# Patient Record
Sex: Male | Born: 1980 | Race: White | Hispanic: No | Marital: Married | Smoking: Never smoker
Health system: Southern US, Community
[De-identification: ages and names within clinical notes are randomized; demographics above are authoritative.]

## PROBLEM LIST (undated history)

## (undated) DIAGNOSIS — L659 Nonscarring hair loss, unspecified: Secondary | ICD-10-CM

## (undated) DIAGNOSIS — F419 Anxiety disorder, unspecified: Secondary | ICD-10-CM

## (undated) DIAGNOSIS — E785 Hyperlipidemia, unspecified: Secondary | ICD-10-CM

## (undated) HISTORY — DX: Nonscarring hair loss, unspecified: L65.9

## (undated) HISTORY — DX: Anxiety disorder, unspecified: F41.9

## (undated) HISTORY — DX: Hyperlipidemia, unspecified: E78.5

---

## 2016-06-08 DIAGNOSIS — E785 Hyperlipidemia, unspecified: Secondary | ICD-10-CM | POA: Diagnosis not present

## 2016-06-22 DIAGNOSIS — E785 Hyperlipidemia, unspecified: Secondary | ICD-10-CM | POA: Diagnosis not present

## 2016-08-21 DIAGNOSIS — L659 Nonscarring hair loss, unspecified: Secondary | ICD-10-CM | POA: Diagnosis not present

## 2016-08-21 DIAGNOSIS — E669 Obesity, unspecified: Secondary | ICD-10-CM | POA: Diagnosis not present

## 2016-08-21 DIAGNOSIS — F419 Anxiety disorder, unspecified: Secondary | ICD-10-CM | POA: Diagnosis not present

## 2016-08-21 DIAGNOSIS — E785 Hyperlipidemia, unspecified: Secondary | ICD-10-CM | POA: Diagnosis not present

## 2016-08-21 DIAGNOSIS — Z0000 Encounter for general adult medical examination without abnormal findings: Secondary | ICD-10-CM | POA: Diagnosis not present

## 2016-08-21 LAB — CBC AND DIFFERENTIAL
HCT: 45 (ref 41–53)
Hemoglobin: 15.8 (ref 13.5–17.5)
Platelets: 252 (ref 150–399)
WBC: 6.2

## 2016-08-21 LAB — BASIC METABOLIC PANEL
BUN: 18 (ref 4–21)
Creatinine: 1.1 (ref 0.6–1.3)
Glucose: 88
Potassium: 4.5 (ref 3.4–5.3)
Sodium: 139 (ref 137–147)

## 2016-08-21 LAB — TSH: TSH: 1.92 (ref 0.41–5.90)

## 2016-08-21 LAB — LIPID PANEL
Cholesterol: 228 — AB (ref 0–200)
HDL: 26 — AB (ref 35–70)
Triglycerides: 616 — AB (ref 40–160)

## 2016-08-21 LAB — HEPATIC FUNCTION PANEL
ALT: 34 (ref 10–40)
AST: 36 (ref 14–40)
Alkaline Phosphatase: 68 (ref 25–125)
Bilirubin, Total: 0.4

## 2016-08-21 LAB — HEMOGLOBIN A1C: Hemoglobin A1C: 5.4

## 2017-02-22 DIAGNOSIS — E782 Mixed hyperlipidemia: Secondary | ICD-10-CM | POA: Diagnosis not present

## 2017-02-22 DIAGNOSIS — Z23 Encounter for immunization: Secondary | ICD-10-CM | POA: Diagnosis not present

## 2017-02-22 DIAGNOSIS — Z6830 Body mass index (BMI) 30.0-30.9, adult: Secondary | ICD-10-CM | POA: Diagnosis not present

## 2017-02-22 DIAGNOSIS — E669 Obesity, unspecified: Secondary | ICD-10-CM | POA: Diagnosis not present

## 2017-08-17 ENCOUNTER — Encounter: Payer: Self-pay | Admitting: Family Medicine

## 2017-08-17 ENCOUNTER — Ambulatory Visit (INDEPENDENT_AMBULATORY_CARE_PROVIDER_SITE_OTHER): Payer: BLUE CROSS/BLUE SHIELD | Admitting: Family Medicine

## 2017-08-17 DIAGNOSIS — E785 Hyperlipidemia, unspecified: Secondary | ICD-10-CM | POA: Diagnosis not present

## 2017-08-17 DIAGNOSIS — F419 Anxiety disorder, unspecified: Secondary | ICD-10-CM | POA: Diagnosis not present

## 2017-08-17 DIAGNOSIS — L649 Androgenic alopecia, unspecified: Secondary | ICD-10-CM | POA: Diagnosis not present

## 2017-08-17 DIAGNOSIS — E782 Mixed hyperlipidemia: Secondary | ICD-10-CM | POA: Insufficient documentation

## 2017-08-17 DIAGNOSIS — E669 Obesity, unspecified: Secondary | ICD-10-CM | POA: Diagnosis not present

## 2017-08-17 MED ORDER — PHENTERMINE-TOPIRAMATE ER 7.5-46 MG PO CP24: ORAL_CAPSULE | ORAL | 0 refills | Status: DC

## 2017-08-17 MED ORDER — PHENTERMINE-TOPIRAMATE ER 11.25-69 MG PO CP24: 1.0000 | ORAL_CAPSULE | Freq: Every day | ORAL | 2 refills | Status: DC

## 2017-08-17 MED ORDER — PHENTERMINE-TOPIRAMATE ER 3.75-23 MG PO CP24: 1.0000 | ORAL_CAPSULE | Freq: Every day | ORAL | 0 refills | Status: DC

## 2017-08-17 NOTE — Patient Instructions (Signed)
It was very nice to meet you today!  I will send in the qsymia.  No other changes today.  Come back soon for your physical.  Come back in 3 months for a recheck on qsymia.  Take care, Dr Jimmey Ralph

## 2017-08-17 NOTE — Progress Notes (Signed)
Subjective:  Kevin Finley is a 37 y.o. male who presents today with a chief complaint of obesity and to establish care  HPI:  Obesity, chronic problem New to provider.  Several year history.  Has been on weight loss medications in the past including QSymia  Tolerated this well without any significant side effects.  Would like to restart medication today.  Weight has been stable.  Hyperlipidemia, chronic problem New to provider.  Has been on Crestor for about a year.  Also on fenofibrate.  Cholesterol levels have "always been high" and he hopes that they have been stable on his current medications.  No reported side effects from Crestor or fenofibrate.  Alopecia, chronic problem New to provider.  Stable.  Takes finasteride 1 mg daily.  Anxiety, chronic problem New to provider.  Stable on Lexapro 100 mg daily.  Tolerates this dose well without side effects.  ROS: Per HPI, otherwise a complete review of systems was negative.   PMH:  The following were reviewed and entered/updated in epic: Past Medical History:  Diagnosis Date  . Alopecia   . Anxiety   . Hyperlipidemia    Patient Active Problem List   Diagnosis Date Noted  . Obesity 08/17/2017  . Hyperlipidemia 08/17/2017  . 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 Anxiety 08/17/2017   History reviewed. No pertinent surgical history.  Family History  Problem Relation Age of Onset  . Heart disease Father   . Diabetes Maternal Grandmother   . Diabetes Maternal Grandfather   . Kidney disease Maternal Grandfather   . Heart disease Maternal Grandfather     Medications- reviewed and updated Current Outpatient Medications  Medication Sig Dispense Refill  . fenofibrate 160 MG tablet Take by mouth.    . finasteride (PROPECIA) 1 MG tablet TAKE ONE TABLET BY MOUTH ONCE DAILY    . rosuvastatin (CRESTOR) 10 MG tablet Take by mouth.    . sertraline (ZOLOFT) 100 MG tablet TAKE ONE TABLET BY MOUTH ONCE DAILY    .  Phentermine-Topiramate (QSYMIA) 11.25-69 MG CP24 Take 1 tablet by mouth daily. Take 1 pill daily for 14 days. 30 capsule 2  . Phentermine-Topiramate (QSYMIA) 3.75-23 MG CP24 Take 1 tablet by mouth daily for 14 days. 14 capsule 0  . [START ON 08/31/2017] Phentermine-Topiramate (QSYMIA) 7.5-46 MG CP24 Take 1 pill daily for 14 days. 14 capsule 0   No current facility-administered medications for this visit.     Allergies-reviewed and updated No Known Allergies  Social History   Socioeconomic History  . Marital status: Married    Spouse name: Not on file  . Number of children: 1  . Years of education: Not on file  . Highest education level: Not on file  Occupational History  . Not on file  Social Needs  . Financial resource strain: Not on file  . Food insecurity:    Worry: Not on file    Inability: Not on file  . Transportation needs:    Medical: Not on file    Non-medical: Not on file  Tobacco Use  . Smoking status: Never Smoker  . Smokeless tobacco: Never Used  Substance and Sexual Activity  . Alcohol use: Yes    Alcohol/week: 1.2 oz    Types: 2 Standard drinks or equivalent per week  . Drug use: Never  . Sexual activity: Not on file  Lifestyle  . Physical activity:    Days per week: Not on file    Minutes per session: Not on  file  . Stress: Not on file  Relationships  . Social connections:    Talks on phone: Not on file    Gets together: Not on file    Attends religious service: Not on file    Active member of club or organization: Not on file    Attends meetings of clubs or organizations: Not on file    Relationship status: Not on file  Other Topics Concern  . Not on file  Social History Narrative  . Not on file     Objective:  Physical Exam: BP (!) 128/98   Pulse 66   Temp 98.8 F (37.1 C)   Resp 14   Ht  (1.905 m)   Wt 250 lb (113.4 kg)   SpO2 96%   BMI 31.25 kg/m   Gen: NAD, resting comfortably CV: RRR with no murmurs appreciated Pulm:  NWOB, CTAB with no crackles, wheezes, or rhonchi GI: Obese, normal bowel sounds present. Soft, Nontender, Nondistended. MSK: No edema, cyanosis, or clubbing noted Skin: Warm, dry Neuro: Grossly normal, moves all extremities Psych: Normal affect and thought content  Assessment/Plan:  Obesity BMI 31.25 today.  Will restart qsymia per his request.  Discussed possible side effects of medication including palpitations, increased blood pressure, tachycardia, insomnia, increased agitation.  We will start at low dose and titrate to goal of 11.25-69 mg tablet daily.  He will follow-up with me in 3 months.  Hyperlipidemia Continue fenofibrate 160 mg daily and Crestor 10 mg daily.  Patient will return soon for fasting blood work including lipid panel.  Androgenic alopecia Stable.  Continue finasteride 1 mg daily.  Anxiety Stable.  Continue Zoloft 100 mg daily.  Preventative healthcare Obtain records from previous PCP.  Patient will follow-up soon for his CPE.  Katina Degree. Jimmey Ralph, MD 08/17/2017 9:35 AM

## 2017-08-17 NOTE — Assessment & Plan Note (Signed)
Stable.  Continue finasteride 1 mg daily. 

## 2017-08-17 NOTE — Assessment & Plan Note (Signed)
BMI 31.25 today.  Will restart qsymia per his request.  Discussed possible side effects of medication including palpitations, increased blood pressure, tachycardia, insomnia, increased agitation.  We will start at low dose and titrate to goal of 11.25-69 mg tablet daily.  He will follow-up with me in 3 months.

## 2017-08-17 NOTE — Assessment & Plan Note (Signed)
Stable.  Continue Zoloft 100 mg daily. 

## 2017-08-17 NOTE — Assessment & Plan Note (Signed)
Continue fenofibrate 160 mg daily and Crestor 10 mg daily.  Patient will return soon for fasting blood work including lipid panel.

## 2017-09-20 ENCOUNTER — Other Ambulatory Visit: Payer: Self-pay

## 2017-09-20 ENCOUNTER — Encounter: Payer: Self-pay | Admitting: Family Medicine

## 2017-09-20 MED ORDER — PHENTERMINE-TOPIRAMATE ER 7.5-46 MG PO CP24: 1.0000 | ORAL_CAPSULE | Freq: Every day | ORAL | 0 refills | Status: DC

## 2017-09-21 ENCOUNTER — Ambulatory Visit: Payer: BLUE CROSS/BLUE SHIELD | Admitting: Family Medicine

## 2017-09-21 DIAGNOSIS — Z0289 Encounter for other administrative examinations: Secondary | ICD-10-CM

## 2017-09-28 ENCOUNTER — Encounter: Payer: Self-pay | Admitting: Family Medicine

## 2017-09-28 ENCOUNTER — Ambulatory Visit (INDEPENDENT_AMBULATORY_CARE_PROVIDER_SITE_OTHER): Payer: BLUE CROSS/BLUE SHIELD | Admitting: Family Medicine

## 2017-09-28 VITALS — BP 132/78 | HR 74 | Temp 98.3°F | Ht 75.0 in | Wt 244.2 lb

## 2017-09-28 DIAGNOSIS — E669 Obesity, unspecified: Secondary | ICD-10-CM

## 2017-09-28 DIAGNOSIS — Z6830 Body mass index (BMI) 30.0-30.9, adult: Secondary | ICD-10-CM

## 2017-09-28 MED ORDER — FENOFIBRATE 160 MG PO TABS: 160.0000 mg | ORAL_TABLET | Freq: Every day | ORAL | 3 refills | Status: DC

## 2017-09-28 MED ORDER — ROSUVASTATIN CALCIUM 10 MG PO TABS: 10.0000 mg | ORAL_TABLET | Freq: Every day | ORAL | 3 refills | Status: DC

## 2017-09-28 MED ORDER — PHENTERMINE-TOPIRAMATE ER 11.25-69 MG PO CP24: 1.0000 | ORAL_CAPSULE | Freq: Every day | ORAL | 2 refills | Status: DC

## 2017-09-28 NOTE — Progress Notes (Signed)
   Subjective:  Kevin Finley is a 37 y.o. male who presents today with a chief complaint of obesity follow up.   HPI:  Obesity, chronic problem, improving Patient seen about a month ago for this.  We restarted his Q Samia.  He is currently on the 7.5-46.  He has been on this for the past week.  No reported side effects.  No chest pain, shortness of breath, palpitations, difficulty sleeping, or headaches.  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  ROS: Per HPI  Objective:  Physical Exam: BP 132/78 (BP Location: Left Arm, Patient Position: Sitting, Cuff Size: Normal)   Pulse 74   Temp 98.3 F (36.8 C) (Oral)   Ht 6\' 3"  (1.905 m)   Wt 244 lb 3.2 oz (110.8 kg)   SpO2 96%   BMI 30.52 kg/m   Wt Readings from Last 3 Encounters:  09/28/17 244 lb 3.2 oz (110.8 kg)  08/17/17 250 lb (113.4 kg)  Gen: NAD, resting comfortably CV: RRR with no murmurs appreciated.  Radial pulses 2+ bilaterally. Pulm: NWOB, CTAB with no crackles, wheezes, or rhonchi  Assessment/Plan:  Obesity Patient down about 6 pounds since his last visit.  Congratulated patient on his weight loss.  He has a goal of 220 pounds.  We will increase his dose of Qsymia to 11.25-69 mg per manufacturer instructions.  He will follow-up with me in 2 to 3 months for his CPE.  Preventative healthcare Patient will follow-up soon for CPE with blood work.  Katina Degreealeb M. Jimmey RalphParker, MD 09/28/2017 8:43 AM

## 2017-09-28 NOTE — Assessment & Plan Note (Signed)
Patient down about 6 pounds since his last visit.  Congratulated patient on his weight loss.  He has a goal of 220 pounds.  We will increase his dose of Qsymia to 11.25-69 mg per manufacturer instructions.  He will follow-up with me in 2 to 3 months for his CPE.

## 2017-09-28 NOTE — Patient Instructions (Signed)
It was very nice to see you today!  I am glad to hear that you are doing well.  Please continue your current dose of Qsymia until you run out of the 7.5 dose.  We will send in a new dose at 11.25.  Please let me know if you have any sort of palpitations, chest pain, increased anxiety or irritation, or difficulty sleeping.  Please let me know if you have increased blood pressure or heart rate as well.  Come back to see me in 2 to 3 months for your physical, or sooner as needed.  Take care, Dr Jimmey RalphParker

## 2018-02-01 ENCOUNTER — Encounter: Payer: Self-pay | Admitting: Family Medicine

## 2018-02-01 ENCOUNTER — Ambulatory Visit (INDEPENDENT_AMBULATORY_CARE_PROVIDER_SITE_OTHER): Payer: BLUE CROSS/BLUE SHIELD | Admitting: Family Medicine

## 2018-02-01 VITALS — BP 128/78 | HR 70 | Temp 98.6°F | Ht 75.0 in | Wt 250.8 lb

## 2018-02-01 DIAGNOSIS — R739 Hyperglycemia, unspecified: Secondary | ICD-10-CM

## 2018-02-01 DIAGNOSIS — E785 Hyperlipidemia, unspecified: Secondary | ICD-10-CM | POA: Diagnosis not present

## 2018-02-01 DIAGNOSIS — Z6830 Body mass index (BMI) 30.0-30.9, adult: Secondary | ICD-10-CM

## 2018-02-01 DIAGNOSIS — E669 Obesity, unspecified: Secondary | ICD-10-CM | POA: Diagnosis not present

## 2018-02-01 DIAGNOSIS — Z23 Encounter for immunization: Secondary | ICD-10-CM

## 2018-02-01 DIAGNOSIS — F419 Anxiety disorder, unspecified: Secondary | ICD-10-CM

## 2018-02-01 LAB — COMPREHENSIVE METABOLIC PANEL
ALT: 33 U/L (ref 0–53)
AST: 21 U/L (ref 0–37)
Albumin: 4.8 g/dL (ref 3.5–5.2)
Alkaline Phosphatase: 44 U/L (ref 39–117)
BUN: 21 mg/dL (ref 6–23)
CO2: 27 mEq/L (ref 19–32)
Calcium: 10.1 mg/dL (ref 8.4–10.5)
Chloride: 103 mEq/L (ref 96–112)
Creatinine, Ser: 1.33 mg/dL (ref 0.40–1.50)
GFR: 64.58 mL/min (ref >60.00)
Glucose, Bld: 93 mg/dL (ref 70–99)
Potassium: 4.8 mEq/L (ref 3.5–5.1)
Sodium: 138 mEq/L (ref 135–145)
Total Bilirubin: 0.5 mg/dL (ref 0.2–1.2)
Total Protein: 7.6 g/dL (ref 6.0–8.3)

## 2018-02-01 LAB — CBC
HCT: 46 % (ref 39.0–52.0)
Hemoglobin: 15.7 g/dL (ref 13.0–17.0)
MCHC: 34.1 g/dL (ref 30.0–36.0)
MCV: 88.9 fl (ref 78.0–100.0)
Platelets: 299 10*3/uL (ref 150.0–400.0)
RBC: 5.18 Mil/uL (ref 4.22–5.81)
RDW: 13.3 % (ref 11.5–15.5)
WBC: 5.5 10*3/uL (ref 4.0–10.5)

## 2018-02-01 LAB — LIPID PANEL
Cholesterol: 205 mg/dL — ABNORMAL HIGH (ref 0–200)
HDL: 34.7 mg/dL — ABNORMAL LOW (ref >39.00)
NonHDL: 170
Total CHOL/HDL Ratio: 6
Triglycerides: 231 mg/dL — ABNORMAL HIGH (ref 0.0–149.0)
VLDL: 46.2 mg/dL — ABNORMAL HIGH (ref 0.0–40.0)

## 2018-02-01 LAB — LDL CHOLESTEROL, DIRECT: Direct LDL: 128 mg/dL

## 2018-02-01 LAB — HEMOGLOBIN A1C: Hgb A1c MFr Bld: 5.5 % (ref 4.6–6.5)

## 2018-02-01 MED ORDER — SEMAGLUTIDE(0.25 OR 0.5MG/DOS) 2 MG/1.5ML ~~LOC~~ SOPN: 0.5000 mg | PEN_INJECTOR | SUBCUTANEOUS | 1 refills | Status: DC

## 2018-02-01 NOTE — Patient Instructions (Signed)
It was very nice to see you today!  We will start ozempic today. Please inject 0.25mg  once weekly.  Do this for 4 weeks, then increase to 0.5 mg weekly.  Come back to see me in 3 to 6 months.  Let me know if you have any severe side effects or if the medication does not work for you.   We will check blood work today.  We will give your flu shot today.  Take care, Dr Jimmey Ralph

## 2018-02-01 NOTE — Assessment & Plan Note (Addendum)
Stable.  Continue sertraline 100 mg daily. Check CBC and CMET.

## 2018-02-01 NOTE — Assessment & Plan Note (Signed)
Check lipid panel today.  Continue Crestor 10 mg daily and fenofibrate 160 mg daily.

## 2018-02-01 NOTE — Assessment & Plan Note (Signed)
BMI 31.35 today.  Discussed treatment options with patient.  We will start Ozempic 0.25 mg weekly for the next 4 weeks, then increase to 0.5 mg weekly.  Discussed possible side effects of this medication.  He will follow-up with me in 3 to 6 months.

## 2018-02-01 NOTE — Progress Notes (Signed)
   Subjective:  Kevin Finley is a 37 y.o. male who presents today with a chief complaint of obesity.   HPI:  Obesity, chronic problem, stable Last seen about 4 months ago this.  He has previously been on Qsymia, but has stopped over the  last several weeks.  He has been trying to eat more healthy.  He has gained about 6 pounds over the last several months.  HLD with hypertriglyceridemia Currently on Crestor 10 mg daily and fenofibrate 160 mg daily.  He has been compliant with these medications.  Anxiety On Zoloft 100 mg daily and tolerating well.  Symptoms are stable.  ROS: Per HPI  PMH: He reports that he has never smoked. He has never used smokeless tobacco. He reports that he drinks about 2.0 standard drinks of alcohol per week. He reports that he does not use drugs.  Objective:  Physical Exam: BP 128/78 (BP Location: Left Arm, Patient Position: Sitting, Cuff Size: Large)   Pulse 70   Temp 98.6 F (37 C) (Oral)   Ht 6\' 3"  (1.905 m)   Wt 250 lb 12.8 oz (113.8 kg)   SpO2 96%   BMI 31.35 kg/m   Wt Readings from Last 3 Encounters:  02/01/18 250 lb 12.8 oz (113.8 kg)  09/28/17 244 lb 3.2 oz (110.8 kg)  08/17/17 250 lb (113.4 kg)  Gen: NAD, resting comfortably CV: RRR with no murmurs appreciated Pulm: NWOB, CTAB with no crackles, wheezes, or rhonchi GI: Normal bowel sounds present. Soft, Nontender, Nondistended.  Assessment/Plan:  Hyperlipidemia Check lipid panel today.  Continue Crestor 10 mg daily and fenofibrate 160 mg daily.  Obesity BMI 31.35 today.  Discussed treatment options with patient.  We will start Ozempic 0.25 mg weekly for the next 4 weeks, then increase to 0.5 mg weekly.  Discussed possible side effects of this medication.  He will follow-up with me in 3 to 6 months.  Anxiety Stable.  Continue sertraline 100 mg daily. Check CBC and CMET.   Hyperglycemia Check A1c.   Preventative Healthcare Flu shot given today.   Katina Degree. Jimmey Ralph, MD 02/01/2018  10:48 AM

## 2018-02-04 ENCOUNTER — Telehealth: Payer: Self-pay | Admitting: Family Medicine

## 2018-02-04 NOTE — Progress Notes (Signed)
Please inform patient of the following:  Blood counts are normal.  Electrolytes, kidney function, liver function, and blood sugar levels are normal. His cholesterol is a bit high, but still improved from last year. I think this will continue to improve as he loses weight. Would recommend continuing his current doses of all medications and rechecking again in 1 year.  Katina Degree. Jimmey Ralph, MD 02/04/2018 5:01 PM

## 2018-02-04 NOTE — Telephone Encounter (Signed)
-----   Message from Frann Rider sent at 02/01/2018  8:24 AM EDT ----- Patient was charged fee and he actually reschedule his appt talked to billing and never got back to him.

## 2018-02-04 NOTE — Telephone Encounter (Signed)
I emailed charge corrections today to waive the $50 no show fee for date of service 09-21-17 out of courtesy. Awaiting a response email.

## 2018-02-07 ENCOUNTER — Encounter: Payer: Self-pay | Admitting: Family Medicine

## 2018-04-25 DIAGNOSIS — J343 Hypertrophy of nasal turbinates: Secondary | ICD-10-CM | POA: Diagnosis not present

## 2018-04-25 DIAGNOSIS — R0683 Snoring: Secondary | ICD-10-CM | POA: Diagnosis not present

## 2018-04-25 DIAGNOSIS — J358 Other chronic diseases of tonsils and adenoids: Secondary | ICD-10-CM | POA: Diagnosis not present

## 2018-05-24 DIAGNOSIS — Z3141 Encounter for fertility testing: Secondary | ICD-10-CM | POA: Diagnosis not present

## 2018-06-10 DIAGNOSIS — J111 Influenza due to unidentified influenza virus with other respiratory manifestations: Secondary | ICD-10-CM | POA: Diagnosis not present

## 2018-06-17 ENCOUNTER — Encounter: Payer: Self-pay | Admitting: Family Medicine

## 2018-07-22 ENCOUNTER — Other Ambulatory Visit: Payer: Self-pay

## 2018-07-22 MED ORDER — SERTRALINE HCL 100 MG PO TABS: 100.0000 mg | ORAL_TABLET | Freq: Every day | ORAL | 1 refills | Status: DC

## 2018-07-22 MED ORDER — FINASTERIDE 1 MG PO TABS: 1.0000 mg | ORAL_TABLET | Freq: Every day | ORAL | 1 refills | Status: DC

## 2018-10-28 ENCOUNTER — Encounter: Payer: Self-pay | Admitting: Family Medicine

## 2018-10-29 ENCOUNTER — Other Ambulatory Visit: Payer: Self-pay

## 2018-10-29 MED ORDER — ROSUVASTATIN CALCIUM 10 MG PO TABS: 10.0000 mg | ORAL_TABLET | Freq: Every day | ORAL | 0 refills | Status: DC

## 2018-10-29 MED ORDER — FENOFIBRATE 160 MG PO TABS: 160.0000 mg | ORAL_TABLET | Freq: Every day | ORAL | 0 refills | Status: DC

## 2018-11-07 ENCOUNTER — Encounter: Payer: Self-pay | Admitting: Sports Medicine

## 2018-11-07 ENCOUNTER — Other Ambulatory Visit: Payer: Self-pay

## 2018-11-07 ENCOUNTER — Ambulatory Visit (INDEPENDENT_AMBULATORY_CARE_PROVIDER_SITE_OTHER): Payer: BC Managed Care – PPO | Admitting: Sports Medicine

## 2018-11-07 VITALS — Temp 97.6°F | Resp 16

## 2018-11-07 DIAGNOSIS — M79674 Pain in right toe(s): Secondary | ICD-10-CM

## 2018-11-07 DIAGNOSIS — B351 Tinea unguium: Secondary | ICD-10-CM | POA: Diagnosis not present

## 2018-11-07 DIAGNOSIS — L659 Nonscarring hair loss, unspecified: Secondary | ICD-10-CM | POA: Insufficient documentation

## 2018-11-07 DIAGNOSIS — M79675 Pain in left toe(s): Secondary | ICD-10-CM | POA: Diagnosis not present

## 2018-11-07 DIAGNOSIS — B359 Dermatophytosis, unspecified: Secondary | ICD-10-CM | POA: Diagnosis not present

## 2018-11-07 DIAGNOSIS — L603 Nail dystrophy: Secondary | ICD-10-CM | POA: Diagnosis not present

## 2018-11-07 MED ORDER — NYSTATIN-TRIAMCINOLONE 100000-0.1 UNIT/GM-% EX OINT: 1.0000 "application " | TOPICAL_OINTMENT | Freq: Two times a day (BID) | CUTANEOUS | 0 refills | Status: DC

## 2018-11-07 MED ORDER — CLOTRIMAZOLE 1 % EX SOLN: 1.0000 "application " | Freq: Two times a day (BID) | CUTANEOUS | 5 refills | Status: DC

## 2018-11-07 NOTE — Progress Notes (Signed)
Subjective: Kevin Finley is a 38 y.o. male patient seen today in office with complaint of mildly painful thickened and discolored nails bilateral and itching and peeling skin. Patient is desiring treatment for nail changes; has tried OTC topicals/Medication in the past with no improvement. Reports that nails are becoming difficult to manage because of the thickness and skin is worsening over the last year. Patient has no other pedal complaints at this time.   Review of Systems  Skin: Positive for itching and rash.  All other systems reviewed and are negative.   Patient Active Problem List   Diagnosis Date Noted  . Hair loss 11/07/2018  . Obesity 08/17/2017  . Hyperlipidemia 08/17/2017  . 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 Anxiety 08/17/2017    Current Outpatient Medications on File Prior to Visit  Medication Sig Dispense Refill  . fenofibrate 160 MG tablet Take 1 tablet (160 mg total) by mouth daily. 30 tablet 0  . finasteride (PROPECIA) 1 MG tablet Take 1 tablet (1 mg total) by mouth daily. 90 tablet 1  . rosuvastatin (CRESTOR) 10 MG tablet Take 1 tablet (10 mg total) by mouth daily. 30 tablet 0  . sertraline (ZOLOFT) 100 MG tablet Take 1 tablet (100 mg total) by mouth daily. 90 tablet 1   No current facility-administered medications on file prior to visit.     No Known Allergies  Objective: Physical Exam  General: Well developed, nourished, no acute distress, awake, alert and oriented x 3  Vascular: Dorsalis pedis artery 2/4 bilateral, Posterior tibial artery 2/4 bilateral, skin temperature warm to warm proximal to distal bilateral lower extremities, no varicosities, pedal hair present bilateral.  Neurological: Gross sensation present via light touch bilateral.   Dermatological: Skin is warm, dry, and supple bilateral, Nails 1-10 are tender, short thick, and discolored with mild subungal debris with right 2&5 and left 2,3,&5 most involved, scaly skin in moccassion  distribution bilateral,  no webspace macerations present bilateral, no open lesions present bilateral, no callus/corns/hyperkeratotic tissue present bilateral. No signs of infection bilateral.  Musculoskeletal: Hallux extensus bilateral. Muscular strength within normal limits without painon range of motion. No pain with calf compression bilateral.  Assessment and Plan:  Problem List Items Addressed This Visit    None    Visit Diagnoses    Nail fungus    -  Primary   Relevant Medications   nystatin-triamcinolone ointment (MYCOLOG)   clotrimazole (LOTRIMIN) 1 % external solution   Other Relevant Orders   Culture, fungus without smear   Tinea       Relevant Medications   nystatin-triamcinolone ointment (MYCOLOG)   clotrimazole (LOTRIMIN) 1 % external solution   Toe pain, bilateral          -Examined patient -Discussed treatment options for painful dystrophic nails  -Fungal culture was obtained by removing a portion of the hard nail itself from each of the involved toenails using a sterile nail nipper and sent to [facility] lab. Patient tolerated the biopsy procedure well without discomfort or need for anesthesia.  -Rx Mycolog and Lotrimin solution to use as instructed  -Patient to return in 3-4 weeks for follow up evaluation and discussion of fungal culture results or sooner if symptoms worsen.  Landis Martins, DPM

## 2018-11-11 ENCOUNTER — Ambulatory Visit (INDEPENDENT_AMBULATORY_CARE_PROVIDER_SITE_OTHER): Payer: BLUE CROSS/BLUE SHIELD | Admitting: Family Medicine

## 2018-11-11 ENCOUNTER — Encounter: Payer: Self-pay | Admitting: Family Medicine

## 2018-11-11 VITALS — Temp 97.5°F | Wt 249.0 lb

## 2018-11-11 DIAGNOSIS — Z6830 Body mass index (BMI) 30.0-30.9, adult: Secondary | ICD-10-CM

## 2018-11-11 DIAGNOSIS — L649 Androgenic alopecia, unspecified: Secondary | ICD-10-CM

## 2018-11-11 DIAGNOSIS — E669 Obesity, unspecified: Secondary | ICD-10-CM

## 2018-11-11 DIAGNOSIS — F419 Anxiety disorder, unspecified: Secondary | ICD-10-CM

## 2018-11-11 DIAGNOSIS — E785 Hyperlipidemia, unspecified: Secondary | ICD-10-CM

## 2018-11-11 MED ORDER — SERTRALINE HCL 100 MG PO TABS: 100.0000 mg | ORAL_TABLET | Freq: Every day | ORAL | 1 refills | Status: DC

## 2018-11-11 MED ORDER — FENOFIBRATE 160 MG PO TABS: 160.0000 mg | ORAL_TABLET | Freq: Every day | ORAL | 3 refills | Status: DC

## 2018-11-11 MED ORDER — RYBELSUS 3 MG PO TABS: 3.0000 mg | ORAL_TABLET | Freq: Every day | ORAL | 5 refills | Status: DC

## 2018-11-11 MED ORDER — FINASTERIDE 1 MG PO TABS: 1.0000 mg | ORAL_TABLET | Freq: Every day | ORAL | 1 refills | Status: DC

## 2018-11-11 MED ORDER — ROSUVASTATIN CALCIUM 10 MG PO TABS: 10.0000 mg | ORAL_TABLET | Freq: Every day | ORAL | 3 refills | Status: DC

## 2018-11-11 NOTE — Assessment & Plan Note (Signed)
Stable.  Continue finasteride 1 mg daily. 

## 2018-11-11 NOTE — Progress Notes (Signed)
    Chief Complaint:  Kevin Finley is a 38 y.o. male who presents today for a virtual office visit with a chief complaint of anxiety follow up.   Assessment/Plan:  Anxiety Stable.  Continue sertraline 100 mg daily.  Androgenic alopecia Stable.  Continue finasteride 1 mg daily.  Hyperlipidemia Stable.  Continue fenofibrate 160 mg daily and Crestor 10 mg daily.  Check lipid panel next blood draw.  Obesity Discussed lifestyle modifications.  Did not have insurance coverage for Ozempic unfortunately.  Will try rybelsus.     Subjective:  HPI:  His stable, chronic medical conditions are outlined below:  #Anxiety - On Zoloft 100 mg daily and tolerating well - ROS: No reported SI or HI.  #Antigenic of the patient - On finasteride 1 mg daily and tolerating well  #Dyslipidemia - On fenofibrate 160 mg daily and Crestor 10 mg daily.  Tolerating both of these well without side effects.  # Obesity -Not currently on any medication.  Insurance did not cover [place].  ROS: Per HPI  PMH: He reports that he has never smoked. He has never used smokeless tobacco. He reports current alcohol use of about 2.0 standard drinks of alcohol per week. He reports that he does not use drugs.      Objective/Observations  Physical Exam: Gen: NAD, resting comfortably Pulm: Normal work of breathing Neuro: Grossly normal, moves all extremities Psych: Normal affect and thought content  Virtual Visit via Video   I connected with [employer] on 11/11/18 at  4:20 PM EDT by a video enabled telemedicine application and verified that I am speaking with the correct person using two identifiers. I discussed the limitations of evaluation and management by telemedicine and the availability of in person appointments. The patient expressed understanding and agreed to proceed.   Patient location: Home Provider location: [place] participating in the virtual visit: Myself and  Patient     Algis Greenhouse. Jerline Pain, MD 11/11/2018 11:49 AM

## 2018-11-11 NOTE — Assessment & Plan Note (Signed)
Stable.  Continue fenofibrate 160 mg daily and Crestor 10 mg daily.  Check lipid panel next blood draw.

## 2018-11-11 NOTE — Assessment & Plan Note (Signed)
Stable. Continue sertraline 100mg daily

## 2018-11-11 NOTE — Assessment & Plan Note (Addendum)
Discussed lifestyle modifications.  Did not have insurance coverage for Ozempic unfortunately.  Will try rybelsus. Follow up with me in a few months.

## 2018-11-18 DIAGNOSIS — L739 Follicular disorder, unspecified: Secondary | ICD-10-CM | POA: Diagnosis not present

## 2018-12-05 ENCOUNTER — Ambulatory Visit (INDEPENDENT_AMBULATORY_CARE_PROVIDER_SITE_OTHER): Payer: BC Managed Care – PPO | Admitting: Sports Medicine

## 2018-12-05 ENCOUNTER — Encounter: Payer: Self-pay | Admitting: Sports Medicine

## 2018-12-05 ENCOUNTER — Other Ambulatory Visit: Payer: Self-pay

## 2018-12-05 DIAGNOSIS — B359 Dermatophytosis, unspecified: Secondary | ICD-10-CM | POA: Diagnosis not present

## 2018-12-05 DIAGNOSIS — M79674 Pain in right toe(s): Secondary | ICD-10-CM | POA: Diagnosis not present

## 2018-12-05 DIAGNOSIS — B351 Tinea unguium: Secondary | ICD-10-CM | POA: Diagnosis not present

## 2018-12-05 DIAGNOSIS — M79675 Pain in left toe(s): Secondary | ICD-10-CM | POA: Diagnosis not present

## 2018-12-05 NOTE — Progress Notes (Signed)
Subjective: Kevin Finley is a 38 y.o. male patient seen today in office for fungal culture results. Patient has no other pedal complaints at this time.   Patient Active Problem List   Diagnosis Date Noted  . Obesity 08/17/2017  . Hyperlipidemia 08/17/2017  . 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 Anxiety 08/17/2017    Current Outpatient Medications on File Prior to Visit  Medication Sig Dispense Refill  . clotrimazole (LOTRIMIN) 1 % external solution Apply 1 application topically 2 (two) times daily. In between toes 60 mL 5  . fenofibrate 160 MG tablet Take 1 tablet (160 mg total) by mouth daily. 90 tablet 3  . finasteride (PROPECIA) 1 MG tablet Take 1 tablet (1 mg total) by mouth daily. 90 tablet 1  . nystatin-triamcinolone ointment (MYCOLOG) Apply 1 application topically 2 (two) times daily. 30 g 0  . rosuvastatin (CRESTOR) 10 MG tablet Take 1 tablet (10 mg total) by mouth daily. 90 tablet 3  . Semaglutide (RYBELSUS) 3 MG TABS Take 3 mg by mouth daily. 30 tablet 5  . sertraline (ZOLOFT) 100 MG tablet Take 1 tablet (100 mg total) by mouth daily. 90 tablet 1   No current facility-administered medications on file prior to visit.     No Known Allergies  Objective: Physical Exam  General: Well developed, nourished, no acute distress, awake, alert and oriented x 3  Vascular: Dorsalis pedis artery 2/4 bilateral, Posterior tibial artery 2/4 bilateral, skin temperature warm to warm proximal to distal bilateral lower extremities, no varicosities, pedal hair present bilateral.  Neurological: Gross sensation present via light touch bilateral.   Dermatological: Skin is warm, dry, and supple bilateral, Nails 1-10 are tender, short thick, and discolored with mild subungal debris with right 2&5 and left 2,3,&5 most involved, scaly skin in moccassion distribution bilateral, mildly improved, no webspace macerations present bilateral, no open lesions present bilateral, no callus/corns/hyperkeratotic  tissue present bilateral. No signs of infection bilateral.  Musculoskeletal: Hallux extensus bilateral. Muscular strength within normal limits without painon range of motion. No pain with calf compression bilateral.  Fungal culture + T Rubrum  Assessment and Plan:  Problem List Items Addressed This Visit    None    Visit Diagnoses    Nail fungus    -  Primary   Relevant Orders   Hepatic Function Panel   Toe pain, bilateral       Tinea          -Examined patient -Discussed treatment options for painful mycotic nails -Patient opt for oral Lamisil with full understanding of medication risks; ordered LFTs for review if within normal limits will proceed with sending Rx to pharmacy for lamisil 250mg  PO daily. Anticipate 12 week course.  -Continue with topical lotrim and Myclog creams  -Advised good hygiene habits -Patient to return in 6 weeks for follow up evaluation or sooner if symptoms worsen.  Landis Martins, DPM

## 2018-12-09 DIAGNOSIS — B351 Tinea unguium: Secondary | ICD-10-CM | POA: Diagnosis not present

## 2018-12-09 DIAGNOSIS — Z31441 Encounter for testing of male partner of patient with recurrent pregnancy loss: Secondary | ICD-10-CM | POA: Diagnosis not present

## 2018-12-10 LAB — HEPATIC FUNCTION PANEL
ALT: 31 IU/L (ref 0–44)
AST: 35 IU/L (ref 0–40)
Albumin: 5.1 g/dL — ABNORMAL HIGH (ref 4.0–5.0)
Alkaline Phosphatase: 53 IU/L (ref 39–117)
Bilirubin Total: 0.5 mg/dL (ref 0.0–1.2)
Bilirubin, Direct: 0.12 mg/dL (ref 0.00–0.40)
Total Protein: 7.8 g/dL (ref 6.0–8.5)

## 2018-12-11 ENCOUNTER — Other Ambulatory Visit: Payer: Self-pay | Admitting: Sports Medicine

## 2018-12-11 DIAGNOSIS — B351 Tinea unguium: Secondary | ICD-10-CM

## 2018-12-11 MED ORDER — TERBINAFINE HCL 250 MG PO TABS: 250.0000 mg | ORAL_TABLET | Freq: Every day | ORAL | 0 refills | Status: DC

## 2018-12-11 NOTE — Progress Notes (Signed)
Labs reviewed, Lamisil sent to pharmacy -Dr. Cannon Kettle

## 2018-12-12 ENCOUNTER — Telehealth: Payer: Self-pay

## 2018-12-12 NOTE — Telephone Encounter (Signed)
Called Pt back to inform him of his lab results and the Rx (Lamisl) that was sent to his pharmacy. Pt was told to follow up in October. Pt stated understanding

## 2018-12-12 NOTE — Progress Notes (Signed)
Called Pt back and informed him of his lab results and about his Rx (lamisil) that was sent to his pharmacy.

## 2018-12-12 NOTE — Telephone Encounter (Signed)
-----   Message from Landis Martins, [place] sent at 12/11/2018  1:41 PM EDT ----- Will you let patient know that I reviewed his labs and sent Lamisil to his pharmacy for him to take 1 tab daily for his fungus. We will re-check him in October to see how he is doing with the medication Thanks Dr. Cannon Kettle

## 2019-01-17 ENCOUNTER — Ambulatory Visit (INDEPENDENT_AMBULATORY_CARE_PROVIDER_SITE_OTHER): Payer: BC Managed Care – PPO | Admitting: Sports Medicine

## 2019-01-17 ENCOUNTER — Other Ambulatory Visit: Payer: Self-pay

## 2019-01-17 ENCOUNTER — Encounter: Payer: Self-pay | Admitting: Sports Medicine

## 2019-01-17 DIAGNOSIS — S90122A Contusion of left lesser toe(s) without damage to nail, initial encounter: Secondary | ICD-10-CM | POA: Diagnosis not present

## 2019-01-17 DIAGNOSIS — B351 Tinea unguium: Secondary | ICD-10-CM

## 2019-01-17 DIAGNOSIS — M79675 Pain in left toe(s): Secondary | ICD-10-CM | POA: Diagnosis not present

## 2019-01-17 DIAGNOSIS — M79674 Pain in right toe(s): Secondary | ICD-10-CM | POA: Diagnosis not present

## 2019-01-17 NOTE — Progress Notes (Signed)
Subjective: Kevin Finley is a 38 y.o. male patient seen today in office for follow up evaluation of nail fungus on Lamisil. Patient states that he is doing well with no adverse reaction can see some clearance. Reports that a ladder fell on foot and hit his left 2nd toe last night. Patient denies pain or drainage but admits swelling an redness. Patient has no other pedal complaints at this time.   Patient Active Problem List   Diagnosis Date Noted  . Obesity 08/17/2017  . Hyperlipidemia 08/17/2017  . 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 Anxiety 08/17/2017    Current Outpatient Medications on File Prior to Visit  Medication Sig Dispense Refill  . clotrimazole (LOTRIMIN) 1 % external solution Apply 1 application topically 2 (two) times daily. In between toes 60 mL 5  . fenofibrate 160 MG tablet Take 1 tablet (160 mg total) by mouth daily. 90 tablet 3  . finasteride (PROPECIA) 1 MG tablet Take 1 tablet (1 mg total) by mouth daily. 90 tablet 1  . nystatin-triamcinolone ointment (MYCOLOG) Apply 1 application topically 2 (two) times daily. 30 g 0  . rosuvastatin (CRESTOR) 10 MG tablet Take 1 tablet (10 mg total) by mouth daily. 90 tablet 3  . Semaglutide (RYBELSUS) 3 MG TABS Take 3 mg by mouth daily. 30 tablet 5  . sertraline (ZOLOFT) 100 MG tablet Take 1 tablet (100 mg total) by mouth daily. 90 tablet 1  . terbinafine (LAMISIL) 250 MG tablet Take 1 tablet (250 mg total) by mouth daily. 90 tablet 0   No current facility-administered medications on file prior to visit.     No Known Allergies  Objective: Physical Exam  General: Well developed, nourished, no acute distress, awake, alert and oriented x 3  Vascular: Dorsalis pedis artery 2/4 bilateral, Posterior tibial artery 2/4 bilateral, skin temperature warm to warm proximal to distal bilateral lower extremities, no varicosities, pedal hair present bilateral.  Neurological: Gross sensation present via light touch bilateral.    Dermatological: Skin is warm, dry, and supple bilateral, Nails 1-10 are tender, short thick, and discolored with mild subungal debris and early clearance noted at proximal nail bed, There is blood at 2nd toenail with mild soft tissue swelling and ecchymosis with 2nd toenail well attached at left 2nd toe,  No signs of infection bilateral.  Musculoskeletal: No symptomatic boney deformities noted bilateral. Muscular strength within normal limits without painon range of motion. No pain with calf compression bilateral.  Assessment and Plan:  Problem List Items Addressed This Visit    None    Visit Diagnoses    Nail fungus    -  Primary   Relevant Orders   Hepatic Function Panel   Toe pain, bilateral       Contusion of lesser toe of left foot without damage to nail, initial encounter         -Examined patient -Patient declined xrays of left foot -Recommend rest, ice, elevation until 2nd toe is improved -Cont with Lamisil; a new set of LFTs were ordered; will call patient to stop medication if abnormal  -Advised good hygiene habits and educated patient on proper foot care to prevent re-infection -Patient to return in 6 weeks for follow up evaluation or sooner if symptoms worsen.  Landis Martins, DPM

## 2019-02-28 ENCOUNTER — Ambulatory Visit (INDEPENDENT_AMBULATORY_CARE_PROVIDER_SITE_OTHER): Payer: BC Managed Care – PPO | Admitting: Sports Medicine

## 2019-02-28 ENCOUNTER — Other Ambulatory Visit: Payer: Self-pay

## 2019-02-28 ENCOUNTER — Encounter: Payer: Self-pay | Admitting: Sports Medicine

## 2019-02-28 DIAGNOSIS — S90122D Contusion of left lesser toe(s) without damage to nail, subsequent encounter: Secondary | ICD-10-CM | POA: Diagnosis not present

## 2019-02-28 DIAGNOSIS — B351 Tinea unguium: Secondary | ICD-10-CM

## 2019-02-28 DIAGNOSIS — M79675 Pain in left toe(s): Secondary | ICD-10-CM

## 2019-02-28 DIAGNOSIS — M79674 Pain in right toe(s): Secondary | ICD-10-CM | POA: Diagnosis not present

## 2019-02-28 NOTE — Progress Notes (Signed)
Subjective: Kevin Finley is a 38 y.o. male patient seen today in office for follow up evaluation of nail fungus on Lamisil. Patient states that he is doing well with no adverse reaction. Patient has no other pedal complaints at this time.   Patient Active Problem List   Diagnosis Date Noted  . Obesity 08/17/2017  . Hyperlipidemia 08/17/2017  . 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 Anxiety 08/17/2017    Current Outpatient Medications on File Prior to Visit  Medication Sig Dispense Refill  . clotrimazole (LOTRIMIN) 1 % external solution Apply 1 application topically 2 (two) times daily. In between toes 60 mL 5  . fenofibrate 160 MG tablet Take 1 tablet (160 mg total) by mouth daily. 90 tablet 3  . finasteride (PROPECIA) 1 MG tablet Take 1 tablet (1 mg total) by mouth daily. 90 tablet 1  . nystatin-triamcinolone ointment (MYCOLOG) Apply 1 application topically 2 (two) times daily. 30 g 0  . rosuvastatin (CRESTOR) 10 MG tablet Take 1 tablet (10 mg total) by mouth daily. 90 tablet 3  . Semaglutide (RYBELSUS) 3 MG TABS Take 3 mg by mouth daily. 30 tablet 5  . sertraline (ZOLOFT) 100 MG tablet Take 1 tablet (100 mg total) by mouth daily. 90 tablet 1  . terbinafine (LAMISIL) 250 MG tablet Take 1 tablet (250 mg total) by mouth daily. 90 tablet 0   No current facility-administered medications on file prior to visit.     No Known Allergies  Objective: Physical Exam  General: Well developed, nourished, no acute distress, awake, alert and oriented x 3  Vascular: Dorsalis pedis artery 2/4 bilateral, Posterior tibial artery 2/4 bilateral, skin temperature warm to warm proximal to distal bilateral lower extremities, no varicosities, pedal hair present bilateral.  Neurological: Gross sensation present via light touch bilateral.   Dermatological: Skin is warm, dry, and supple bilateral, Nails 1-10 are tender, short thick, and discolored with mild subungal debris and early clearance noted at proximal  nail bed, there is dry blood noted at the left second toe appears to be stable with no acute signs of infection, no webspace macerations present bilateral, no open lesions present bilateral, no callus/corns/hyperkeratotic tissue present bilateral. No signs of infection bilateral.  Musculoskeletal: No symptomatic boney deformities noted bilateral. Muscular strength within normal limits without painon range of motion. No pain with calf compression bilateral.  Assessment and Plan:  Problem List Items Addressed This Visit    None    Visit Diagnoses    Nail fungus    -  Primary   Relevant Orders   Hepatic Function Panel   Toe pain, bilateral       Contusion of lesser toe of left foot without damage to nail, subsequent encounter          -Examined patient -Cont with Lamisil; a new set of LFTs were ordered; will call patient to stop medication if abnormal  -Advised good hygiene habits and educated patient on proper foot care to prevent re-infection -Advised patient to continue with monitoring left second toe dry blood will slowly grow out -Patient to return as needed or sooner if symptoms worsen.  Landis Martins, DPM

## 2019-07-16 ENCOUNTER — Other Ambulatory Visit: Payer: Self-pay | Admitting: *Deleted

## 2019-07-16 MED ORDER — SERTRALINE HCL 100 MG PO TABS: 100.0000 mg | ORAL_TABLET | Freq: Every day | ORAL | 0 refills | Status: DC

## 2019-07-16 MED ORDER — FINASTERIDE 1 MG PO TABS: 1.0000 mg | ORAL_TABLET | Freq: Every day | ORAL | 0 refills | Status: DC

## 2019-12-11 ENCOUNTER — Other Ambulatory Visit: Payer: Self-pay | Admitting: *Deleted

## 2019-12-11 NOTE — Telephone Encounter (Signed)
Requesting refill Rx Sertraline   Last refills on 07/16/2019 #90   0 Ref  Last OV 12/08/2018

## 2019-12-12 MED ORDER — FINASTERIDE 1 MG PO TABS: 1.0000 mg | ORAL_TABLET | Freq: Every day | ORAL | 0 refills | Status: DC

## 2019-12-12 MED ORDER — SERTRALINE HCL 100 MG PO TABS: 100.0000 mg | ORAL_TABLET | Freq: Every day | ORAL | 0 refills | Status: DC

## 2019-12-12 NOTE — Telephone Encounter (Signed)
30 day supply sent in - he needs OV  Kevin Finley. Jimmey Ralph, MD 12/12/2019 2:22 PM

## 2019-12-12 NOTE — Telephone Encounter (Signed)
Pt notified, will call after the weekend to schedule appointment with PCP

## 2019-12-22 ENCOUNTER — Other Ambulatory Visit: Payer: Self-pay | Admitting: Family Medicine

## 2020-04-29 ENCOUNTER — Encounter: Payer: Self-pay | Admitting: Family Medicine

## 2020-04-29 ENCOUNTER — Other Ambulatory Visit: Payer: Self-pay

## 2020-04-29 ENCOUNTER — Ambulatory Visit (INDEPENDENT_AMBULATORY_CARE_PROVIDER_SITE_OTHER): Payer: BC Managed Care – PPO | Admitting: Family Medicine

## 2020-04-29 VITALS — BP 125/84 | HR 62 | Temp 97.6°F | Ht 75.0 in | Wt 249.0 lb

## 2020-04-29 DIAGNOSIS — R739 Hyperglycemia, unspecified: Secondary | ICD-10-CM

## 2020-04-29 DIAGNOSIS — G4730 Sleep apnea, unspecified: Secondary | ICD-10-CM | POA: Diagnosis not present

## 2020-04-29 DIAGNOSIS — F419 Anxiety disorder, unspecified: Secondary | ICD-10-CM | POA: Diagnosis not present

## 2020-04-29 DIAGNOSIS — E785 Hyperlipidemia, unspecified: Secondary | ICD-10-CM | POA: Diagnosis not present

## 2020-04-29 DIAGNOSIS — Z23 Encounter for immunization: Secondary | ICD-10-CM | POA: Diagnosis not present

## 2020-04-29 LAB — COMPREHENSIVE METABOLIC PANEL
ALT: 26 U/L (ref 0–53)
AST: 20 U/L (ref 0–37)
Albumin: 4.8 g/dL (ref 3.5–5.2)
Alkaline Phosphatase: 48 U/L (ref 39–117)
BUN: 24 mg/dL — ABNORMAL HIGH (ref 6–23)
CO2: 29 mEq/L (ref 19–32)
Calcium: 10.1 mg/dL (ref 8.4–10.5)
Chloride: 104 mEq/L (ref 96–112)
Creatinine, Ser: 1.29 mg/dL (ref 0.40–1.50)
GFR: 70.36 mL/min (ref >60.00)
Glucose, Bld: 95 mg/dL (ref 70–99)
Potassium: 4.1 mEq/L (ref 3.5–5.1)
Sodium: 138 mEq/L (ref 135–145)
Total Bilirubin: 0.5 mg/dL (ref 0.2–1.2)
Total Protein: 7.9 g/dL (ref 6.0–8.3)

## 2020-04-29 LAB — CBC
HCT: 45.1 % (ref 39.0–52.0)
Hemoglobin: 15.5 g/dL (ref 13.0–17.0)
MCHC: 34.4 g/dL (ref 30.0–36.0)
MCV: 85.7 fl (ref 78.0–100.0)
Platelets: 293 10*3/uL (ref 150.0–400.0)
RBC: 5.26 Mil/uL (ref 4.22–5.81)
RDW: 13 % (ref 11.5–15.5)
WBC: 5.7 10*3/uL (ref 4.0–10.5)

## 2020-04-29 LAB — LIPID PANEL
Cholesterol: 188 mg/dL (ref 0–200)
HDL: 36.6 mg/dL — ABNORMAL LOW (ref >39.00)
LDL Cholesterol: 122 mg/dL — ABNORMAL HIGH (ref 0–99)
NonHDL: 151.24
Total CHOL/HDL Ratio: 5
Triglycerides: 147 mg/dL (ref 0.0–149.0)
VLDL: 29.4 mg/dL (ref 0.0–40.0)

## 2020-04-29 LAB — HEMOGLOBIN A1C: Hgb A1c MFr Bld: 5.7 % (ref 4.6–6.5)

## 2020-04-29 LAB — TSH: TSH: 1.74 u[IU]/mL (ref 0.35–4.50)

## 2020-04-29 MED ORDER — SERTRALINE HCL 100 MG PO TABS: 100.0000 mg | ORAL_TABLET | Freq: Every day | ORAL | 3 refills | Status: DC

## 2020-04-29 NOTE — Progress Notes (Signed)
   Chaynce Stailey is a 40 y.o. male who presents today for an office visit.  Assessment/Plan:  Chronic Problems Addressed Today: Sleep-disordered breathing We will place order for home sleep study.  Anxiety Stable.  Continue Zoloft 200 mg daily.  Hyperlipidemia Check CT calcium score per patient request to screen for CAD.  Check lipids, CBC, CMET, TSH.  Continue Crestor and fenofibrate.  Preventative health care Flu vaccine given today.      Subjective:  HPI:  See A/p.         Objective:  Physical Exam: BP 125/84   Pulse 62   Temp 97.6 F (36.4 C) (Temporal)   Ht 6\' 3"  (1.905 m)   Wt 249 lb (112.9 kg)   SpO2 98%   BMI 31.12 kg/m   Wt Readings from Last 3 Encounters:  04/29/20 249 lb (112.9 kg)  11/11/18 249 lb (112.9 kg)  02/01/18 250 lb 12.8 oz (113.8 kg)  Gen: No acute distress, resting comfortably CV: Regular rate and rhythm with no murmurs appreciated Pulm: Normal work of breathing, clear to auscultation bilaterally with no crackles, wheezes, or rhonchi Neuro: Grossly normal, moves all extremities Psych: Normal affect and thought content      Jakyrie Totherow M. 02/03/18, MD 04/29/2020 8:41 AM

## 2020-04-29 NOTE — Assessment & Plan Note (Signed)
We will place order for home sleep study.

## 2020-04-29 NOTE — Patient Instructions (Signed)
It was very nice to see you today!  We will give your flu vaccine today.  I will place referral for you to have a cardiac CT scan performed as well as a sleep study.  You should be contacted within the next week or so about getting this done.  We will check blood work today.  I like to see you back in year for an annual checkup.  Please come back to see me sooner if needed.+ 1.  She is already on a PPI  Take care, Dr Jimmey Ralph  Please try these tips to maintain a healthy lifestyle:   Eat at least 3 REAL meals and 1-2 snacks per day.  Aim for no more than 5 hours between eating.  If you eat breakfast, please do so within one hour of getting up.    Each meal should contain half fruits/vegetables, one quarter protein, and one quarter carbs (no bigger than a computer mouse)   Cut down on sweet beverages. This includes juice, soda, and sweet tea.     Drink at least 1 glass of water with each meal and aim for at least 8 glasses per day   Exercise at least 150 minutes every week.

## 2020-04-29 NOTE — Assessment & Plan Note (Signed)
Stable.  Continue Zoloft 200 mg daily. 

## 2020-04-29 NOTE — Assessment & Plan Note (Signed)
Check CT calcium score per patient request to screen for CAD.  Check lipids, CBC, CMET, TSH.  Continue Crestor and fenofibrate.

## 2020-04-30 NOTE — Progress Notes (Signed)
Please inform patient of the following:  Cholesterol and blood sugar are borderline but overall stable. Do not need to make any changes to medications at this time. He should continue working on diet and exercise and we can recheck in a year.

## 2020-05-03 ENCOUNTER — Telehealth: Payer: Self-pay

## 2020-05-03 NOTE — Telephone Encounter (Signed)
[facility] called and stated that a order was put in for a sleep study but they need a referral instead of a order.

## 2020-05-10 NOTE — Telephone Encounter (Signed)
Has this been handled?

## 2020-05-12 NOTE — Telephone Encounter (Signed)
See note

## 2020-05-13 NOTE — Telephone Encounter (Signed)
Please place referral to sleep medicine.  Katina Degree. Jimmey Ralph, MD 05/13/2020 12:34 PM

## 2020-05-14 ENCOUNTER — Other Ambulatory Visit: Payer: Self-pay | Admitting: *Deleted

## 2020-05-14 DIAGNOSIS — G4730 Sleep apnea, unspecified: Secondary | ICD-10-CM

## 2020-05-14 NOTE — Telephone Encounter (Signed)
Referral placed.

## 2020-05-26 ENCOUNTER — Other Ambulatory Visit: Payer: Self-pay

## 2020-05-26 ENCOUNTER — Ambulatory Visit (INDEPENDENT_AMBULATORY_CARE_PROVIDER_SITE_OTHER)
Admission: RE | Admit: 2020-05-26 | Discharge: 2020-05-26 | Disposition: A | Discharge status: Home / Self Care | Payer: Self-pay | Source: Ambulatory Visit | Attending: Family Medicine | Admitting: Family Medicine

## 2020-05-26 DIAGNOSIS — E785 Hyperlipidemia, unspecified: Secondary | ICD-10-CM

## 2020-05-27 ENCOUNTER — Encounter: Payer: Self-pay | Admitting: Family Medicine

## 2020-05-28 NOTE — Telephone Encounter (Signed)
See note

## 2020-06-01 ENCOUNTER — Other Ambulatory Visit: Payer: Self-pay | Admitting: *Deleted

## 2020-06-01 DIAGNOSIS — R931 Abnormal findings on diagnostic imaging of heart and coronary circulation: Secondary | ICD-10-CM

## 2020-06-01 MED ORDER — ATORVASTATIN CALCIUM 40 MG PO TABS: 40.0000 mg | ORAL_TABLET | Freq: Every day | ORAL | 3 refills | Status: DC

## 2020-06-01 NOTE — Progress Notes (Signed)
Please inform patient of the following:  His cardiac calcium score is significantly elevated. Recommend we start lipitor 40mg  daily have refer him to see a cardiologist.  . Katina Degree, MD 06/01/2020 12:58 PM

## 2020-06-02 ENCOUNTER — Telehealth (INDEPENDENT_AMBULATORY_CARE_PROVIDER_SITE_OTHER): Payer: BC Managed Care – PPO | Admitting: Family Medicine

## 2020-06-02 ENCOUNTER — Other Ambulatory Visit: Payer: Self-pay

## 2020-06-02 ENCOUNTER — Ambulatory Visit (INDEPENDENT_AMBULATORY_CARE_PROVIDER_SITE_OTHER): Payer: BC Managed Care – PPO | Admitting: Cardiology

## 2020-06-02 ENCOUNTER — Encounter: Payer: Self-pay | Admitting: Cardiology

## 2020-06-02 ENCOUNTER — Encounter: Payer: Self-pay | Admitting: Family Medicine

## 2020-06-02 VITALS — Ht 75.0 in | Wt 250.0 lb

## 2020-06-02 VITALS — BP 134/90 | HR 76 | Ht 75.0 in | Wt 250.0 lb

## 2020-06-02 DIAGNOSIS — Z9189 Other specified personal risk factors, not elsewhere classified: Secondary | ICD-10-CM

## 2020-06-02 DIAGNOSIS — E785 Hyperlipidemia, unspecified: Secondary | ICD-10-CM

## 2020-06-02 DIAGNOSIS — I2510 Atherosclerotic heart disease of native coronary artery without angina pectoris: Secondary | ICD-10-CM | POA: Diagnosis not present

## 2020-06-02 DIAGNOSIS — Z712 Person consulting for explanation of examination or test findings: Secondary | ICD-10-CM | POA: Diagnosis not present

## 2020-06-02 DIAGNOSIS — E782 Mixed hyperlipidemia: Secondary | ICD-10-CM

## 2020-06-02 DIAGNOSIS — Z7189 Other specified counseling: Secondary | ICD-10-CM

## 2020-06-02 DIAGNOSIS — F419 Anxiety disorder, unspecified: Secondary | ICD-10-CM

## 2020-06-02 NOTE — Assessment & Plan Note (Signed)
Worsened due to cardiac CT recent findings.  He is on Zoloft 100 mg daily.  He will let me know if you would like to change dose of medications or if continues have significant anxiety.

## 2020-06-02 NOTE — Progress Notes (Signed)
   Kevin Finley is a 40 y.o. male who presents today for a telephone visit.  Assessment/Plan:  Chronic Problems Addressed Today: Anxiety Worsened due to cardiac CT recent findings.  He is on Zoloft 100 mg daily.  He will let me know if you would like to change dose of medications or if continues have significant anxiety.  Hyperlipidemia Had lengthy discussion with patient regarding his recent cardiac CT score which showed calcium score of 401 which was at the 99th percentile.  We switched him from medium intensity to high intensity statin with Lipitor 40 mg daily.  He will be following up with cardiology later today.  Discussed with patient that he is not in any imminent risk of heart attack however we should be much more aggressive with his lipid control in the future.  Briefly discussed lifestyle modifications including regular exercise and high-fiber diet.     Subjective:  HPI:  See a/p.  Patient would like to discuss recent cardiac CT score findings.  He and his wife have quite a bit of anxiety surrounding this. He is concerned about imminent heart attack.         Objective/Observations   NAD  Telephone Visit   I connected with Kevin Finley on 06/02/20 at 11:20 AM EST via telephone and verified that I am speaking with the correct person using two identifiers. I discussed the limitations of evaluation and management by telemedicine and the availability of in person appointments. The patient expressed understanding and agreed to proceed.   Patient location: Home Provider location: [place] [facility] [employer] Persons participating in the virtual visit: Myself and Patient     Katina Degree. Jimmey Ralph, MD 06/02/2020 9:57 AM

## 2020-06-02 NOTE — Patient Instructions (Signed)
Medication Instructions:  Your Physician recommend you continue on your current medication as directed.    *If you need a refill on your cardiac medications before your next appointment, please call your pharmacy*   Lab Work: Your physician recommends that you return for lab work in 2 months (Fasting lipid, LFT).  If you have labs (blood work) drawn today and your tests are completely normal, you will receive your results only by: Marland Kitchen MyChart Message (if you have MyChart) OR . A paper copy in the mail If you have any lab test that is abnormal or we need to change your treatment, we will call you to review the results.   Testing/Procedures: Your physician has requested that you have an exercise tolerance test. For further information please visit [email or web address] Please also follow instruction sheet, as given. [address]  You will need to have the coronavirus test completed prior to your procedure. This is a Drive Up Visit at [address] [place], [place], [place] [address]. Please tell them that you are there for procedure testing. Stay in your car and someone will be with you shortly. Please make sure to have all other labs completed before this test because you will need to stay quarantined until your procedure.   Follow-Up: At [facility], you and your health needs are our priority.  As part of our continuing mission to provide you with exceptional heart care, we have created designated Provider Care Teams.  These Care Teams include your primary Cardiologist (physician) and Advanced Practice Providers (APPs -  Physician Assistants and Nurse Practitioners) who all work together to provide you with the care you need, when you need it.  We recommend signing up for the patient portal called "MyChart".  Sign up information is provided on this After Visit Summary.  MyChart is used to connect with patients for Virtual Visits (Telemedicine).  Patients are able to view  lab/test results, encounter notes, upcoming appointments, etc.  Non-urgent messages can be sent to your provider as well.   To learn more about what you can do with MyChart, go to [email or web address].    Your next appointment:   1 year(s)  The format for your next appointment:   In Person  Provider:   Jodelle Red, MD    [facility] at [facility] [address], [address] [place], [place] [address] Phone:  [phone]        You are scheduled for an Exercise Stress Test  Please arrive 15 minutes prior to your appointment time for registration and insurance purposes.  The test will take approximately 45 minutes to complete.  How to prepare for your Exercise Stress Test: . Do bring a list of your current medications with you.  If not listed below, you may take your medications as normal. . Do wear comfortable clothes (no dresses or overalls) and walking shoes, tennis shoes preferred (no heels or open toed shoes are allowed) . Do Not wear cologne, perfume, aftershave or lotions (deodorant is allowed). . Please report to [address] [facility], [address] for your test.  If these instructions are not followed, your test will have to be rescheduled.  If you have questions or concerns about your appointment, you can call the Stress Lab at [phone].  If you cannot keep your appointment, please provide 24 hours notification to the Stress Lab, to avoid a possible $50 charge to your account

## 2020-06-02 NOTE — Assessment & Plan Note (Signed)
Had lengthy discussion with patient regarding his recent cardiac CT score which showed calcium score of 401 which was at the 99th percentile.  We switched him from medium intensity to high intensity statin with Lipitor 40 mg daily.  He will be following up with cardiology later today.  Discussed with patient that he is not in any imminent risk of heart attack however we should be much more aggressive with his lipid control in the future.  Briefly discussed lifestyle modifications including regular exercise and high-fiber diet.

## 2020-06-02 NOTE — Progress Notes (Signed)
Cardiology Office Note:    Date:  06/02/2020   ID:  Kevin Finley, DOB [birth year], MRN [id number]  PCP:  Ardith Dark, MD  Cardiologist:  Jodelle Red, MD  Referring MD: Ardith Dark, MD   Chief Complaint  Patient presents with  . New Patient (Initial Visit)    History of Present Illness:    Kevin Finley is a 40 y.o. male with a hx of anxiety, hyperlipidemia who is seen as a new consult at the request of Ardith Dark, MD for the evaluation and management of abnormal calcium score.  Reviewed notes from Dr. Jimmey Ralph. Seen earlier today. Reviewed calcium score results, atorvastatin increased to 40 mg daily at that visit.  First tested for cholesterol at age 65, was told it was "out of the park." Had carotid scan 4-5 years ago, was told there wasn't anything that needed treated. Started rosuvastatin about 4 years ago.   Cardiovascular risk factors: Prior clinical ASCVD: none Comorbid conditions, including hypertension, hyperlipidemia, diabetes, chronic kidney disease: hyperlipidemia, on fenofibrate and was recently on rosuvastatin 10 mg daily, just increased to atorvastatin 40 mg daily. Last A1c 5.7. Metabolic syndrome/Obesity: BMI 31, has tried semaglutide in the past. Chronic inflammatory conditions: none Tobacco use history: socially, not routinely Family history: father had bypass surgery in 2011 (age 5), mother just had bypass surgery at age 81. Mat Gpa was diabetic, had heart issues. Brother has elevated cholesterol, is on medication (4 years younger). Had Ca score and was normal. Prior cardiac testing: Calcium score was 401, 99th percentile. Exercise level: runs on the treadmill at home, goes to the gym Current diet: has two small children, one formula fed and one with a feeding tube. Wife cooks when he is home, but eats out a lot. Eats a lot of red meat. Loves sugar and salt.   Past Medical History:  Diagnosis Date  . Alopecia   . Anxiety   .  Hyperlipidemia     History reviewed. No pertinent surgical history.  Current Medications: Current Outpatient Medications on File Prior to Visit  Medication Sig  . atorvastatin (LIPITOR) 40 MG tablet Take 1 tablet (40 mg total) by mouth daily.  . fenofibrate 160 MG tablet TAKE 1 TABLET BY MOUTH EVERY DAY  . finasteride (PROPECIA) 1 MG tablet Take 1 tablet (1 mg total) by mouth daily.  . sertraline (ZOLOFT) 100 MG tablet Take 1 tablet (100 mg total) by mouth daily.   No current facility-administered medications on file prior to visit.     Allergies:   Patient has no known allergies.   Social History   Tobacco Use  . Smoking status: Never Smoker  . Smokeless tobacco: Never Used  Vaping Use  . Vaping Use: Never used  Substance Use Topics  . Alcohol use: Yes    Alcohol/week: 2.0 standard drinks    Types: 2 Standard drinks or equivalent per week  . Drug use: Never    Family History: family history includes Diabetes in his maternal grandfather and maternal grandmother; Heart disease in his father and maternal grandfather; Kidney disease in his maternal grandfather.  ROS:   Please see the history of present illness.  Additional pertinent ROS: Constitutional: Negative for chills, fever, night sweats, unintentional weight loss  HENT: Negative for ear pain and hearing loss.   Eyes: Negative for loss of vision and eye pain.  Respiratory: Negative for cough, sputum, wheezing.   Cardiovascular: See HPI. Gastrointestinal: Negative for abdominal pain, melena, and hematochezia.  Genitourinary: Negative for dysuria and hematuria.  Musculoskeletal: Negative for falls and myalgias.  Skin: Negative for itching and rash.  Neurological: Negative for focal weakness, focal sensory changes and loss of consciousness.  Endo/Heme/Allergies: Does not bruise/bleed easily.     EKGs/Labs/Other Studies Reviewed:    The following studies were reviewed today: No prior cardiac studies  EKG:  EKG is  personally reviewed.  The ekg ordered today demonstrates NSR at 76 bpm  Recent Labs: 04/29/2020: ALT 26; BUN 24; Creatinine, Ser 1.29; Hemoglobin 15.5; Platelets 293.0; Potassium 4.1; Sodium 138; TSH 1.74  Recent Lipid Panel    Component Value Date/Time   CHOL 188 04/29/2020 0845   TRIG 147.0 04/29/2020 0845   HDL 36.60 (L) 04/29/2020 0845   CHOLHDL 5 04/29/2020 0845   VLDL 29.4 04/29/2020 0845   LDLCALC 122 (H) 04/29/2020 0845   LDLDIRECT 128.0 02/01/2018 0909    Physical Exam:    VS:  BP 134/90 (BP Location: Left Arm, Patient Position: Sitting, Cuff Size: Large)   Pulse 76   Ht 6\' 3"  (1.905 m)   Wt 250 lb (113.4 kg)   BMI 31.25 kg/m     Wt Readings from Last 3 Encounters:  06/02/20 250 lb (113.4 kg)  06/02/20 250 lb (113.4 kg)  04/29/20 249 lb (112.9 kg)    GEN: Well nourished, well developed in no acute distress HEENT: Normal, moist mucous membranes NECK: No JVD CARDIAC: regular rhythm, normal S1 and S2, no rubs or gallops. No murmur. VASCULAR: Radial and DP pulses 2+ bilaterally. No carotid bruits RESPIRATORY:  Clear to auscultation without rales, wheezing or rhonchi  ABDOMEN: Soft, non-tender, non-distended MUSCULOSKELETAL:  Ambulates independently SKIN: Warm and dry, no edema NEUROLOGIC:  Alert and oriented x 3. No focal neuro deficits noted. PSYCHIATRIC:  Normal affect    ASSESSMENT:    1. Coronary artery calcification seen on CT scan   2. At increased risk for cardiovascular disease   3. Mixed hyperlipidemia   4. Encounter to discuss test results   5. Cardiac risk counseling   6. Counseling on health promotion and disease prevention    PLAN:    Abnormal calcium score Mixed hyperlipidemia -We reviewed the calcium score at length, including actual images as well as the graph showing mortality based on calcium score. We discussed the pathophysiology of cholesterol plaque formation, the role of calcium and why it is a marker, how plaque is key to acute  MI/CVA, and how known plaque is managed with medications.  -given Ca score greater than 400, will screen for ischemia with exercise treadmill test. -recently increased from 10 mg rosuvastatin to 40 mg atorvastatin daily. Recheck lipids/LFTs in 2-3 mos -discussed recommendations for aspirin -TG peak was 616, recently 147. Recent LDL 122.   Cardiac risk counseling and prevention recommendations: -recommend heart healthy/Mediterranean diet, with whole grains, fruits, vegetable, fish, lean meats, nuts, and olive oil. Limit salt. -recommend moderate walking, 3-5 times/week for 30-50 minutes each session. Aim for at least 150 minutes.week. Goal should be pace of 3 miles/hours, or walking 1.5 miles in 30 minutes -recommend avoidance of tobacco products. Avoid excess alcohol. -Additional risk factor control:  -Diabetes risk: A1c is 5.7  -Lipids: as above  -Weight: BMI 31 -ASCVD risk score: The ASCVD Risk score 05/01/20 DC Jr., et al., 2013) failed to calculate for the following reasons:   The 2013 ASCVD risk score is only valid for ages 88 to 46    Plan for follow up: 1 year or  sooner based on results of treadmill  Jodelle RedBridgette Taiden Raybourn, MD, PhD, [facility] [place]  [facility]    Medication Adjustments/Labs and Tests Ordered: Current medicines are reviewed at length with the patient today.  Concerns regarding medicines are outlined above.  Orders Placed This Encounter  Procedures  . Lipid panel  . Hepatic function panel  . Cardiac Stress Test: Informed Consent Details: Physician/Practitioner Attestation; Transcribe to consent form and obtain patient signature  . EXERCISE TOLERANCE TEST (ETT)  . EKG 12-Lead   No orders of the defined types were placed in this encounter.   Patient Instructions  Medication Instructions:  Your Physician recommend you continue on your current medication as directed.    *If you need a refill on your cardiac medications before your next appointment, please  call your pharmacy*   Lab Work: Your physician recommends that you return for lab work in 2 months (Fasting lipid, LFT).  If you have labs (blood work) drawn today and your tests are completely normal, you will receive your results only by: Marland Kitchen. MyChart Message (if you have MyChart) OR . A paper copy in the mail If you have any lab test that is abnormal or we need to change your treatment, we will call you to review the results.   Testing/Procedures: Your physician has requested that you have an exercise tolerance test. For further information please visit [email or web address]. Please also follow instruction sheet, as given. [address]  You will need to have the coronavirus test completed prior to your procedure. This is a Drive Up Visit at [id number], [place], [place] [id number]. Please tell them that you are there for procedure testing. Stay in your car and someone will be with you shortly. Please make sure to have all other labs completed before this test because you will need to stay quarantined until your procedure.   Follow-Up: At [facility], you and your health needs are our priority.  As part of our continuing mission to provide you with exceptional heart care, we have created designated Provider Care Teams.  These Care Teams include your primary Cardiologist (physician) and Advanced Practice Providers (APPs -  Physician Assistants and Nurse Practitioners) who all work together to provide you with the care you need, when you need it.  We recommend signing up for the patient portal called "MyChart".  Sign up information is provided on this After Visit Summary.  MyChart is used to connect with patients for Virtual Visits (Telemedicine).  Patients are able to view lab/test results, encounter notes, upcoming appointments, etc.  Non-urgent messages can be sent to your provider as well.   To learn more about what you can do with MyChart, go to  [email or web address].    Your next appointment:   1 year(s)  The format for your next appointment:   In Person  Provider:   Jodelle RedBridgette Zaineb Nowaczyk, MD    [facility] [address] [facility] [address], [address] [place], [place] [address] Phone:  [phone]        You are scheduled for an Exercise Stress Test  Please arrive 15 minutes prior to your appointment time for registration and insurance purposes.  The test will take approximately 45 minutes to complete.  How to prepare for your Exercise Stress Test: . Do bring a list of your current medications with you.  If not listed below, you may take your medications as normal. . Do wear comfortable clothes (no dresses or overalls) and walking  shoes, tennis shoes preferred (no heels or open toed shoes are allowed) . Do Not wear cologne, perfume, aftershave or lotions (deodorant is allowed). . Please report to [address] [facility], [address] for your test.  If these instructions are not followed, your test will have to be rescheduled.  If you have questions or concerns about your appointment, you can call the Stress Lab at [phone].  If you cannot keep your appointment, please provide 24 hours notification to the Stress Lab, to avoid a possible $50 charge to your account    Signed, Jodelle Red, MD PhD 06/02/2020     [facility]

## 2020-06-06 ENCOUNTER — Encounter: Payer: Self-pay | Admitting: Cardiology

## 2020-06-08 ENCOUNTER — Other Ambulatory Visit (HOSPITAL_COMMUNITY): Payer: BC Managed Care – PPO

## 2020-06-11 ENCOUNTER — Ambulatory Visit (HOSPITAL_COMMUNITY)
Admission: RE | Admit: 2020-06-11 | Payer: BC Managed Care – PPO | Source: Ambulatory Visit | Attending: Cardiology | Admitting: Cardiology

## 2020-08-13 ENCOUNTER — Encounter: Payer: Self-pay | Admitting: Family Medicine

## 2020-08-16 ENCOUNTER — Other Ambulatory Visit: Payer: Self-pay | Admitting: *Deleted

## 2020-08-16 MED ORDER — SERTRALINE HCL 100 MG PO TABS: 100.0000 mg | ORAL_TABLET | Freq: Every day | ORAL | 3 refills | Status: DC

## 2020-08-16 MED ORDER — ATORVASTATIN CALCIUM 40 MG PO TABS: 40.0000 mg | ORAL_TABLET | Freq: Every day | ORAL | 3 refills | Status: DC

## 2020-08-16 MED ORDER — FENOFIBRATE 160 MG PO TABS: 160.0000 mg | ORAL_TABLET | Freq: Every day | ORAL | 3 refills | Status: DC

## 2020-08-18 ENCOUNTER — Other Ambulatory Visit: Payer: Self-pay | Admitting: *Deleted

## 2020-08-18 ENCOUNTER — Telehealth (HOSPITAL_COMMUNITY): Payer: Self-pay | Admitting: *Deleted

## 2020-08-18 MED ORDER — FINASTERIDE 1 MG PO TABS: 1.0000 mg | ORAL_TABLET | Freq: Every day | ORAL | 0 refills | Status: DC

## 2020-08-18 MED ORDER — SERTRALINE HCL 100 MG PO TABS: 100.0000 mg | ORAL_TABLET | Freq: Every day | ORAL | 3 refills | Status: DC

## 2020-08-18 NOTE — Telephone Encounter (Signed)
Close encounter 

## 2020-08-18 NOTE — Telephone Encounter (Signed)
Rx send to [facility]

## 2020-08-19 ENCOUNTER — Ambulatory Visit (HOSPITAL_COMMUNITY)
Admission: RE | Admit: 2020-08-19 | Discharge: 2020-08-19 | Disposition: A | Discharge status: Home / Self Care | Payer: Self-pay | Source: Ambulatory Visit | Attending: Cardiology | Admitting: Cardiology

## 2020-08-19 ENCOUNTER — Other Ambulatory Visit: Payer: Self-pay

## 2020-08-19 DIAGNOSIS — I2510 Atherosclerotic heart disease of native coronary artery without angina pectoris: Secondary | ICD-10-CM | POA: Insufficient documentation

## 2020-08-19 DIAGNOSIS — Z9189 Other specified personal risk factors, not elsewhere classified: Secondary | ICD-10-CM | POA: Insufficient documentation

## 2020-08-19 LAB — EXERCISE TOLERANCE TEST
Estimated workload: 13.7 METS
Exercise duration (min): 11 min
Exercise duration (sec): 40 s
MPHR: 182 {beats}/min
Peak HR: 153 {beats}/min
Percent HR: 84 %
Rest HR: 66 {beats}/min

## 2020-08-26 ENCOUNTER — Institutional Professional Consult (permissible substitution): Payer: BC Managed Care – PPO | Admitting: Pulmonary Disease

## 2020-10-01 ENCOUNTER — Other Ambulatory Visit: Payer: Self-pay | Admitting: Family Medicine

## 2020-11-11 ENCOUNTER — Other Ambulatory Visit: Payer: Self-pay | Admitting: Family Medicine

## 2020-11-19 ENCOUNTER — Encounter: Payer: Self-pay | Admitting: Family Medicine

## 2020-11-22 ENCOUNTER — Other Ambulatory Visit: Payer: Self-pay

## 2020-11-22 MED ORDER — FINASTERIDE 1 MG PO TABS: 1.0000 mg | ORAL_TABLET | Freq: Every day | ORAL | 2 refills | Status: AC

## 2020-11-25 MED ORDER — QSYMIA 3.75-23 MG PO CP24: ORAL_CAPSULE | ORAL | 0 refills | Status: AC

## 2020-11-25 MED ORDER — QSYMIA 7.5-46 MG PO CP24: ORAL_CAPSULE | ORAL | 0 refills | Status: AC

## 2020-11-25 MED ORDER — QSYMIA 11.25-69 MG PO CP24: ORAL_CAPSULE | ORAL | 0 refills | Status: AC

## 2021-08-25 ENCOUNTER — Other Ambulatory Visit: Payer: Self-pay | Admitting: Family Medicine

## 2021-08-25 NOTE — Telephone Encounter (Signed)
Patient need OV last OV 06/02/2020 Unable to contact patient. Voice mail not set up yet

## 2021-11-08 ENCOUNTER — Encounter: Payer: Self-pay | Admitting: Family Medicine

## 2021-11-17 ENCOUNTER — Other Ambulatory Visit: Payer: Self-pay | Admitting: *Deleted

## 2021-11-17 MED ORDER — ATORVASTATIN CALCIUM 40 MG PO TABS: 40.0000 mg | ORAL_TABLET | Freq: Every day | ORAL | 0 refills | Status: AC

## 2021-11-17 MED ORDER — FENOFIBRATE 160 MG PO TABS: 160.0000 mg | ORAL_TABLET | Freq: Every day | ORAL | 0 refills | Status: AC

## 2021-11-17 MED ORDER — SERTRALINE HCL 100 MG PO TABS: 100.0000 mg | ORAL_TABLET | Freq: Every day | ORAL | 0 refills | Status: AC

## 2022-01-02 ENCOUNTER — Encounter: Payer: Self-pay | Admitting: *Deleted

## 2022-01-28 IMAGING — CT CT CARDIAC CORONARY ARTERY CALCIUM SCORE
3 series · 14 of 20 positions shown, 15 images · non-contrast
Comparison: None.
COMPARISON: None.

Addendum:
EXAM:
OVER-READ INTERPRETATION  CT CHEST

The following report is an over-read performed by radiologist Dr.
Mark Antony Sagrero [REDACTED] on 05/26/2020. This
over-read does not include interpretation of cardiac or coronary
anatomy or pathology. The coronary calcium score interpretation by
the cardiologist is attached.
CLINICAL DATA: Risk stratification
Coronary Calcium Score
TECHNIQUE: The patient was scanned on a Siemens Force scanner. Axial
non-contrast 3 mm slices were carried out through the heart. The
data set was analyzed on a dedicated work station and scored using
the Agatson method.

[Series 2: casc 3.0 bv41 2 bestdiast 68 % · axial · 0.44mm/px · z∈[-261,-189]mm · 4 of 42 slices shown, 5 images]
[im 9/42  vessel]
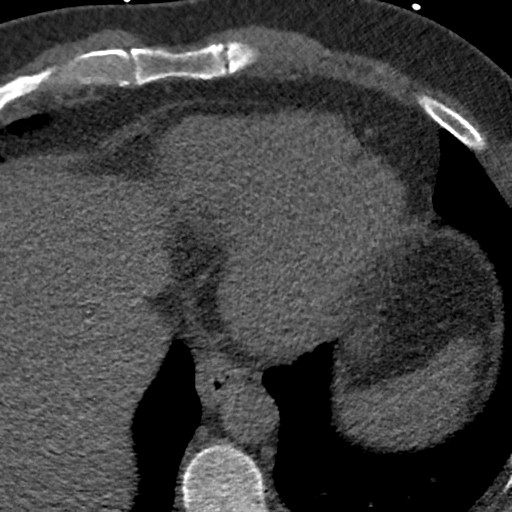
[im 9/42  lung]
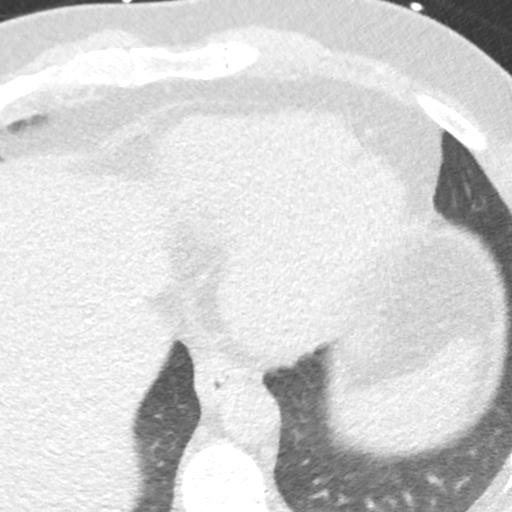
[im 17/42  vessel]
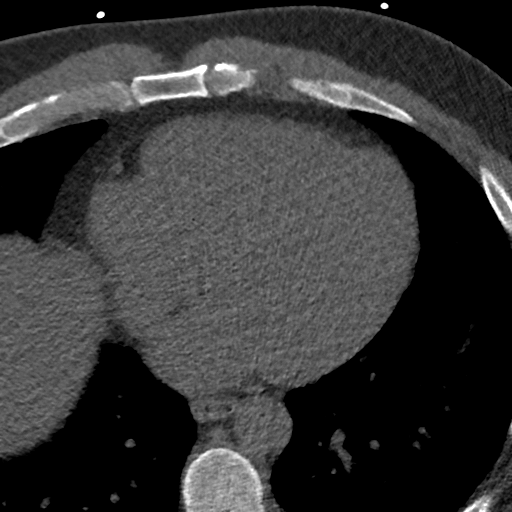
[im 25/42  vessel]
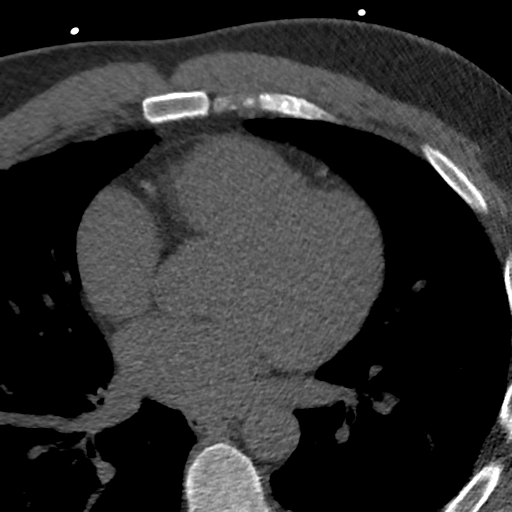
[im 33/42  vessel]
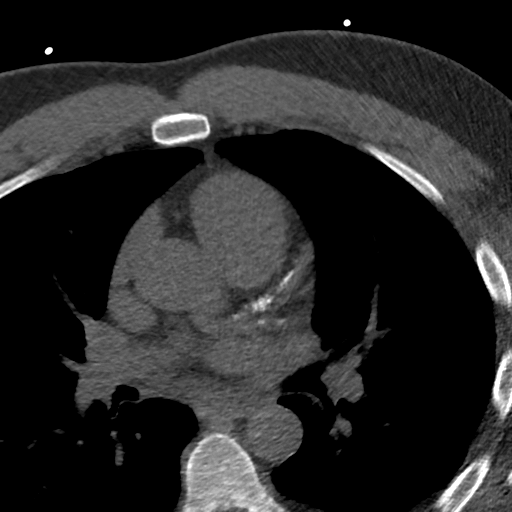

[Series 3: lung 68 % · axial · 0.75mm/px · z∈[-267,-183]mm · 5 of 42 slices shown]
[im 7/42  lung]
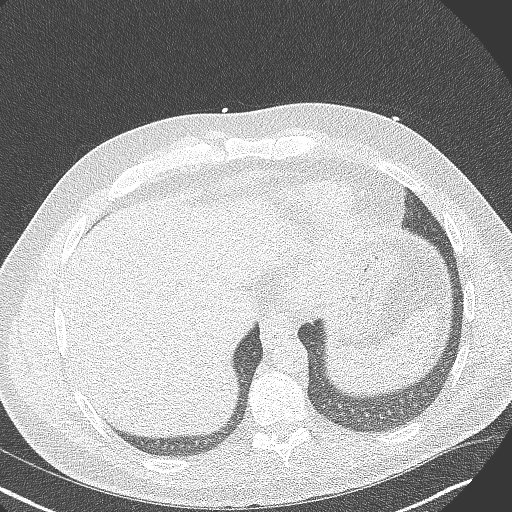
[im 14/42  lung]
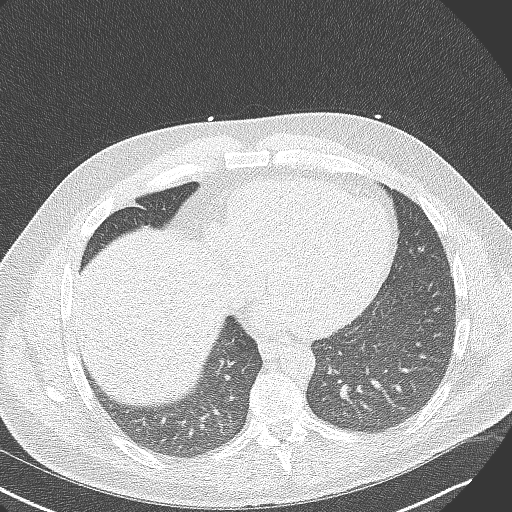
[im 21/42  lung]
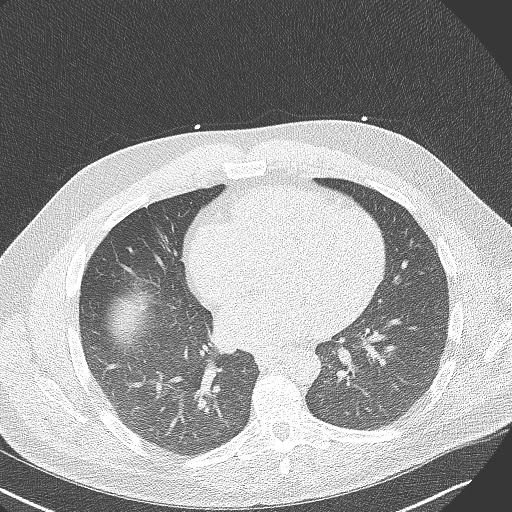
[im 28/42  lung]
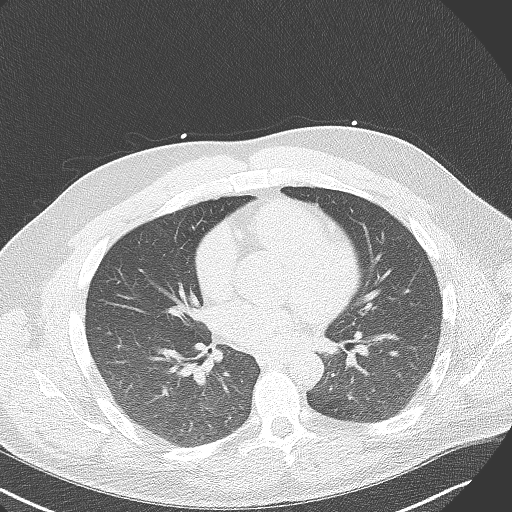
[im 35/42  lung]
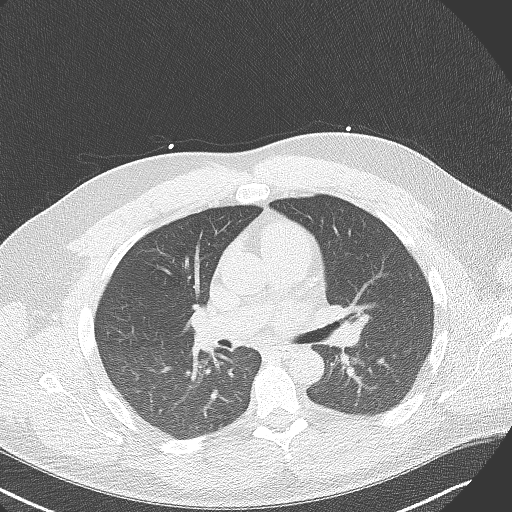

[Series 4: lung st 68 % · axial · 0.75mm/px · z∈[-267,-183]mm · 5 of 42 slices shown]
[im 7/42  lung]
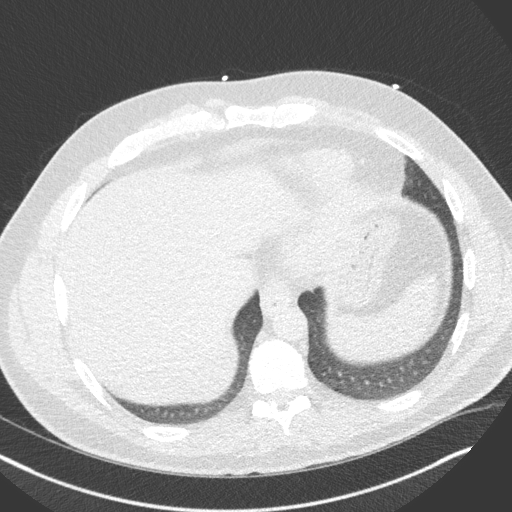
[im 14/42  lung]
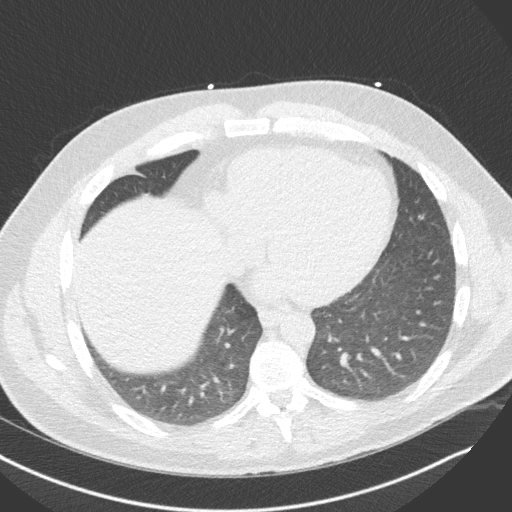
[im 21/42  lung]
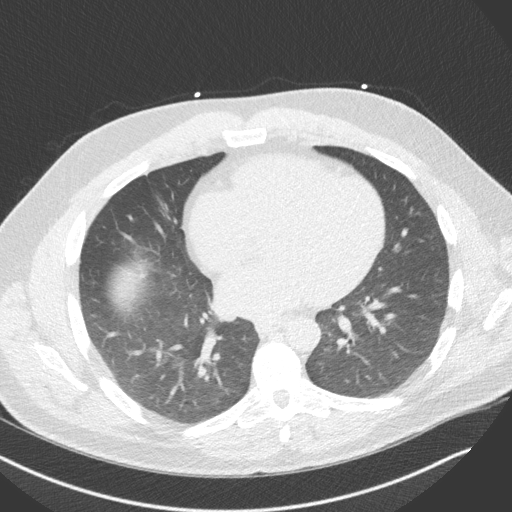
[im 28/42  lung]
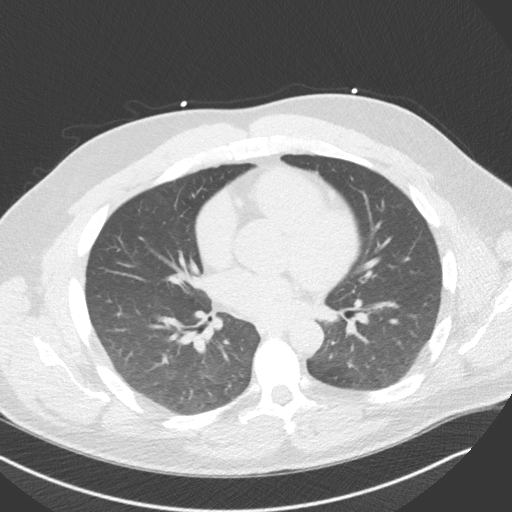
[im 35/42  lung]
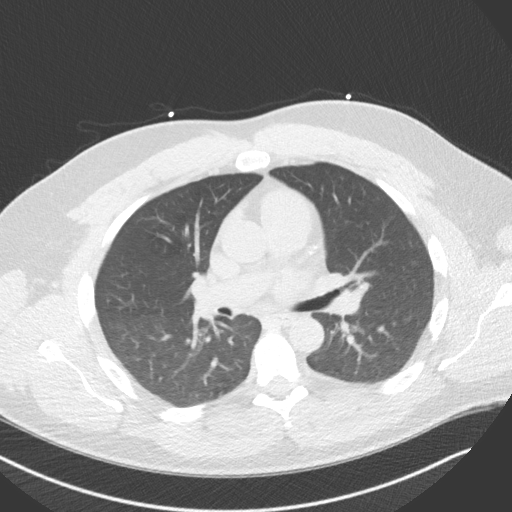

[14 of 20 positions shown; findings below may reference images not displayed]

FINDINGS: Within the visualized portions of the thorax there are no suspicious
appearing pulmonary nodules or masses, there is no acute
consolidative airspace disease, no pleural effusions, no
pneumothorax and no lymphadenopathy. Visualized portions of the
upper abdomen are unremarkable. There are no aggressive appearing
lytic or blastic lesions noted in the visualized portions of the
skeleton.
IMPRESSION: 1. No significant incidental noncardiac findings are noted.
FINDINGS: Non-cardiac: See separate report from [REDACTED].

Ascending Aorta: Normal size, measuring 34 mm at the mid ascending
aorta, measured double oblique at PA bifurcation. No significant
calcification.

Pericardium: Normal

Coronary arteries: Arise from normal coronary cusps.
IMPRESSION: Coronary calcium score of 401. This was 99th percentile for age and
sex matched control.

Rahman Masood

*** End of Addendum ***
EXAM:
OVER-READ INTERPRETATION  CT CHEST

The following report is an over-read performed by radiologist Dr.
Mark Antony Sagrero [REDACTED] on 05/26/2020. This
over-read does not include interpretation of cardiac or coronary
anatomy or pathology. The coronary calcium score interpretation by
the cardiologist is attached.
FINDINGS: Within the visualized portions of the thorax there are no suspicious
appearing pulmonary nodules or masses, there is no acute
consolidative airspace disease, no pleural effusions, no
pneumothorax and no lymphadenopathy. Visualized portions of the
upper abdomen are unremarkable. There are no aggressive appearing
lytic or blastic lesions noted in the visualized portions of the
skeleton.
IMPRESSION: 1. No significant incidental noncardiac findings are noted.

## 2022-02-15 ENCOUNTER — Other Ambulatory Visit: Payer: Self-pay | Admitting: Family Medicine

## 2022-03-23 ENCOUNTER — Encounter: Payer: Self-pay | Admitting: *Deleted
